# Patient Record
Sex: Male | Born: 1995 | Hispanic: Yes | Marital: Single | Smoking: Never smoker
Health system: Southern US, Community
[De-identification: ages and names within clinical notes are randomized; demographics above are authoritative.]

---
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
## 2018-10-08 ENCOUNTER — Emergency Department
Admission: EM | Admit: 2018-10-08 | Discharge: 2018-10-09 | Disposition: A | Discharge status: Home / Self Care | Payer: Self-pay | Attending: Emergency Medicine | Admitting: Emergency Medicine

## 2018-10-08 ENCOUNTER — Encounter: Payer: Self-pay | Admitting: *Deleted

## 2018-10-08 ENCOUNTER — Other Ambulatory Visit: Payer: Self-pay

## 2018-10-08 DIAGNOSIS — L235 Allergic contact dermatitis due to other chemical products: Secondary | ICD-10-CM | POA: Insufficient documentation

## 2018-10-08 MED ORDER — DEXAMETHASONE SODIUM PHOSPHATE 10 MG/ML IJ SOLN
10.0000 mg | Freq: Once | INTRAMUSCULAR | Status: AC
  Administered 2018-10-08: 10 mg via INTRAMUSCULAR
  Filled 2018-10-08: qty 1

## 2018-10-08 MED ORDER — PREDNISONE 20 MG PO TABS: 20.0000 mg | ORAL_TABLET | Freq: Two times a day (BID) | ORAL | 0 refills | Status: DC

## 2018-10-08 MED ORDER — FAMOTIDINE 20 MG PO TABS
40.0000 mg | ORAL_TABLET | Freq: Once | ORAL | Status: AC
  Administered 2018-10-08: 40 mg via ORAL
  Filled 2018-10-08: qty 2

## 2018-10-08 MED ORDER — DIPHENHYDRAMINE HCL 25 MG PO CAPS
50.0000 mg | ORAL_CAPSULE | Freq: Once | ORAL | Status: AC
  Administered 2018-10-08: 22:00:00 50 mg via ORAL
  Filled 2018-10-08: qty 2

## 2018-10-08 MED ORDER — HYDROXYZINE PAMOATE 25 MG PO CAPS: 25.0000 mg | ORAL_CAPSULE | Freq: Three times a day (TID) | ORAL | 0 refills | Status: DC | PRN

## 2018-10-08 MED ORDER — FAMOTIDINE 20 MG PO TABS: 20.0000 mg | ORAL_TABLET | Freq: Two times a day (BID) | ORAL | 0 refills | Status: DC

## 2018-10-08 NOTE — Discharge Instructions (Addendum)
You have been treated for a suspected allergic reaction to some chemical cleaners used at your job. Take the prescriptions as directed. Follow-up with [facility] or return to the ED as needed.

## 2018-10-08 NOTE — ED Triage Notes (Signed)
Pt has a rash to both arms and right side of abdomen.  Pt has itching.  Using otc meds without relief.  Pt alert.

## 2018-10-08 NOTE — ED Notes (Signed)
See triage note. Pt denies difficulty swallowing/SOB. NAD. Calm/alert. Rash noted on chest/arms/upper legs/back. Pt unsure of any trigger.

## 2018-10-09 ENCOUNTER — Telehealth: Payer: Self-pay | Admitting: Family Medicine

## 2018-10-09 MED ORDER — FAMOTIDINE 20 MG PO TABS: 20.0000 mg | ORAL_TABLET | Freq: Two times a day (BID) | ORAL | 0 refills | Status: DC

## 2018-10-09 MED ORDER — HYDROXYZINE PAMOATE 25 MG PO CAPS: 25.0000 mg | ORAL_CAPSULE | Freq: Three times a day (TID) | ORAL | 0 refills | Status: AC | PRN

## 2018-10-09 MED ORDER — PREDNISONE 20 MG PO TABS: 20.0000 mg | ORAL_TABLET | Freq: Two times a day (BID) | ORAL | 0 refills | Status: AC

## 2018-10-09 NOTE — Telephone Encounter (Signed)
Patient presented to triage with discharge paperwork from last night's visit. Medications resubmitted to [facility]. No changes.

## 2018-10-09 NOTE — ED Provider Notes (Signed)
[facility] Emergency Department Provider Note ____________________________________________  Time seen: 2155  I have reviewed the triage vital signs and the nursing notes.  HISTORY  Chief Complaint  Rash  HPI Corey Watson is a 23 y.o. male presents himself to the ED for evaluation of a rash noted to his arms and trunk.  Patient describes rash began about a week ago.  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  He does not wear PPE while spraying the particular chemical cleanser.  He does admit to splash and spray typically falling on his arms as well as splash and spray from the other coworkers around him that are using the same chemical.  He also reports itching to the arms when he gets sweaty and hot while at work.  He presented to the ED today after concern for continued rash and irritation after he use an over-the-counter allergy medicine and topical itch relief cream.  He denies any swelling, bruising, bleeding, chest pain, shortness of breath.  He denies any difficulty controlling his oral secretions.  He presents now for further evaluation of his symptoms.  He denies any known allergies to food and or drugs.  He denies any significant medical history and takes no daily medication.  History reviewed. No pertinent past medical history.  There are no active problems to display for this patient.  History reviewed. No pertinent surgical history.  Prior to Admission medications   Medication Sig Start Date End Date Taking? Authorizing Provider  famotidine (PEPCID) 20 MG tablet Take 1 tablet (20 mg total) by mouth 2 (two) times daily for 7 days. 10/08/18 10/15/18  Deante Blough, Dannielle Karvonen, PA-C  hydrOXYzine (VISTARIL) 25 MG capsule Take 1 capsule (25 mg total) by mouth 3 (three) times daily as needed for up to 10 days for itching. 10/08/18 10/18/18  Leemon Ayala, Dannielle Karvonen, PA-C  predniSONE (DELTASONE) 20 MG tablet Take 1 tablet (20 mg total)  by mouth 2 (two) times daily with a meal for 5 days. 10/08/18 10/13/18  Samreen Seltzer, Dannielle Karvonen, PA-C    Allergies Patient has no known allergies.  No family history on file.  Social History Social History   Tobacco Use  . Smoking status: Never Smoker  . Smokeless tobacco: Never Used  Substance Use Topics  . Alcohol use: Never    Frequency: Never  . Drug use: Never    Review of Systems  Constitutional: Negative for fever. Eyes: Negative for visual changes. ENT: Negative for sore throat. Cardiovascular: Negative for chest pain. Respiratory: Negative for shortness of breath. Gastrointestinal: Negative for abdominal pain, vomiting and diarrhea. Genitourinary: Negative for dysuria. Musculoskeletal: Negative for back pain. Skin: Positive for rash. Neurological: Negative for headaches, focal weakness or numbness. ____________________________________________  PHYSICAL EXAM:  VITAL SIGNS: ED Triage Vitals  Enc Vitals Group     BP 10/08/18 1720 125/84     Pulse Rate 10/08/18 1720 86     Resp 10/08/18 1720 20     Temp 10/08/18 1720 99.3 F (37.4 C)     Temp Source 10/08/18 1720 Oral     SpO2 10/08/18 1720 100 %     Weight --      Height --      Head Circumference --      Peak Flow --      Pain Score 10/08/18 1721 0     Pain Loc --      Pain Edu? --  Excl. in GC? --     Constitutional: Alert and oriented. Well appearing and in no distress. Head: Normocephalic and atraumatic. Eyes: Conjunctivae are normal. Normal extraocular movements Ears: Canals clear. TMs intact bilaterally. Nose: No congestion/rhinorrhea/epistaxis. Mouth/Throat: Mucous membranes are moist.  Uvula is midline tonsils are flat.  No oropharyngeal lesions noted. Neck: Supple. No thyromegaly. Hematological/Lymphatic/Immunological: No cervical lymphadenopathy. Cardiovascular: Normal rate, regular rhythm. Normal distal pulses. Respiratory: Normal respiratory effort. No  wheezes/rales/rhonchi. Musculoskeletal: Nontender with normal range of motion in all extremities.  Neurologic:  Normal gait without ataxia. Normal speech and language. No gross focal neurologic deficits are appreciated. Skin:  Skin is warm, dry and intact.  Patient with a fine maculopapular rash noted to the arms, primarily to the flexor surfaces; trunk, neck, and face.  There are no blisters or excoriations appreciated.  The fine rash is also erythematous compared to his skin. ____________________________________________  PROCEDURES  Procedures Decadron 10 mg IM Famotidine 40 mg PO Benadryl 50 mg PO ____________________________________________  INITIAL IMPRESSION / ASSESSMENT AND PLAN / ED COURSE  Corey Watson was evaluated in Emergency Department on 10/09/2018 for the symptoms described in the history of present illness. He was evaluated in the context of the global COVID-19 pandemic, which necessitated consideration that the patient might be at risk for infection with the SARS-CoV-2 virus that causes COVID-19. Institutional protocols and algorithms that pertain to the evaluation of patients at risk for COVID-19 are in a state of rapid change based on information released by regulatory bodies including the CDC and federal and state organizations. These policies and algorithms were followed during the patient's care in the ED.  Patient with ED evaluation of a probable contact dermatitis secondary to a chemical irritant.  Patient likely has exposure while at work, where he is not required to wear any PPE while using a particular chemical spray.  Patient reports improvement of his symptoms after ED administration of anti-histamines and steroids.  Be discharged with prescriptions for prednisone, famotidine, and Vistaril to take as directed.  He is encouraged to follow with a local community clinic or return to the ED for acutely worsening symptoms.  He should try to avoid working in that  particular area if possible.  Work is provided as requested. ____________________________________________  FINAL CLINICAL IMPRESSION(S) / ED DIAGNOSES  Final diagnoses:  Allergic dermatitis due to other chemical product      Lissa HoardMenshew, Shandi Godfrey V Bacon, PA-C 10/09/18 Corene Cornea0025    Goodman, Graydon, MD 10/12/18 [phone]

## 2018-12-21 ENCOUNTER — Emergency Department
Admission: EM | Admit: 2018-12-21 | Discharge: 2018-12-21 | Disposition: A | Discharge status: Home / Self Care | Payer: HRSA Program | Attending: Emergency Medicine | Admitting: Emergency Medicine

## 2018-12-21 ENCOUNTER — Emergency Department: Payer: HRSA Program

## 2018-12-21 ENCOUNTER — Other Ambulatory Visit: Payer: Self-pay

## 2018-12-21 ENCOUNTER — Encounter: Payer: Self-pay | Admitting: Intensive Care

## 2018-12-21 DIAGNOSIS — U071 COVID-19: Secondary | ICD-10-CM | POA: Insufficient documentation

## 2018-12-21 DIAGNOSIS — J189 Pneumonia, unspecified organism: Secondary | ICD-10-CM | POA: Insufficient documentation

## 2018-12-21 DIAGNOSIS — Z20822 Contact with and (suspected) exposure to covid-19: Secondary | ICD-10-CM

## 2018-12-21 DIAGNOSIS — R05 Cough: Secondary | ICD-10-CM | POA: Diagnosis present

## 2018-12-21 LAB — BASIC METABOLIC PANEL
Anion gap: 8 (ref 5–15)
BUN: 15 mg/dL (ref 6–20)
CO2: 27 mmol/L (ref 22–32)
Calcium: 9.2 mg/dL (ref 8.9–10.3)
Chloride: 106 mmol/L (ref 98–111)
Creatinine, Ser: 0.95 mg/dL (ref 0.61–1.24)
GFR calc Af Amer: >60 mL/min (ref >60)
GFR calc non Af Amer: >60 mL/min (ref >60)
Glucose, Bld: 91 mg/dL (ref 70–99)
Potassium: 3.4 mmol/L — ABNORMAL LOW (ref 3.5–5.1)
Sodium: 141 mmol/L (ref 135–145)

## 2018-12-21 LAB — CBC
HCT: 45.3 % (ref 39.0–52.0)
Hemoglobin: 16 g/dL (ref 13.0–17.0)
MCH: 31.8 pg (ref 26.0–34.0)
MCHC: 35.3 g/dL (ref 30.0–36.0)
MCV: 90.1 fL (ref 80.0–100.0)
Platelets: 185 10*3/uL (ref 150–400)
RBC: 5.03 MIL/uL (ref 4.22–5.81)
RDW: 11.7 % (ref 11.5–15.5)
WBC: 5.4 10*3/uL (ref 4.0–10.5)
nRBC: 0 % (ref 0.0–0.2)

## 2018-12-21 MED ORDER — CEFTRIAXONE SODIUM 1 G IJ SOLR
1.0000 g | Freq: Once | INTRAMUSCULAR | Status: AC
  Administered 2018-12-21: 21:00:00 1 g via INTRAMUSCULAR
  Filled 2018-12-21: qty 10

## 2018-12-21 MED ORDER — AZITHROMYCIN 250 MG PO TABS: ORAL_TABLET | ORAL | 0 refills | Status: AC

## 2018-12-21 MED ORDER — POTASSIUM CHLORIDE CRYS ER 20 MEQ PO TBCR
40.0000 meq | EXTENDED_RELEASE_TABLET | Freq: Once | ORAL | Status: AC
  Administered 2018-12-21: 22:00:00 40 meq via ORAL
  Filled 2018-12-21: qty 2

## 2018-12-21 MED ORDER — AZITHROMYCIN 500 MG PO TABS
500.0000 mg | ORAL_TABLET | Freq: Once | ORAL | Status: AC
  Administered 2018-12-21: 21:00:00 500 mg via ORAL
  Filled 2018-12-21: qty 1

## 2018-12-21 MED ORDER — ACETAMINOPHEN 325 MG PO TABS
650.0000 mg | ORAL_TABLET | Freq: Once | ORAL | Status: AC
  Administered 2018-12-21: 21:00:00 650 mg via ORAL
  Filled 2018-12-21: qty 2

## 2018-12-21 MED ORDER — SODIUM CHLORIDE 0.9 % IV SOLN: 1.0000 g | Freq: Once | INTRAVENOUS | Status: DC

## 2018-12-21 NOTE — ED Provider Notes (Signed)
[facility] Emergency Department Provider Note  ____________________________________________  Time seen: Approximately 8:05 PM  I have reviewed the triage vital signs and the nursing notes.   HISTORY  Chief Complaint Cough    HPI Corey Watson is a 23 y.o. male with no significant past medical history that presents emergency department for evaluation of nonproductive cough for 1.5 weeks.  Patient states that he has been taking NyQuil and drinking honey but cough has not resolved.  He does not smoke.  No history of asthma or allergies.  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  No fever, nasal congestion, sore throat, shortness of breath, chest pain.   History reviewed. No pertinent past medical history.  There are no active problems to display for this patient.   History reviewed. No pertinent surgical history.  Prior to Admission medications   Medication Sig Start Date End Date Taking? Authorizing Provider  azithromycin (ZITHROMAX Z-PAK) 250 MG tablet Take 2 tablets (500 mg) on  Day 1,  followed by 1 tablet (250 mg) once daily on Days 2 through 5. 12/21/18   Enid DerryWagner, Airis Barbee, PA-C  famotidine (PEPCID) 20 MG tablet Take 1 tablet (20 mg total) by mouth 2 (two) times daily for 7 days. 10/09/18 10/16/18  Chinita Pesterriplett, Cari B, FNP    Allergies Patient has no known allergies.  History reviewed. No pertinent family history.  Social History Social History   Tobacco Use  . Smoking status: Never Smoker  . Smokeless tobacco: Never Used  Substance Use Topics  . Alcohol use: Never    Frequency: Never  . Drug use: Never     Review of Systems  Constitutional: No fever/chills Eyes: No visual changes. No discharge. ENT: Negative for congestion and rhinorrhea. Cardiovascular: No chest pain. Respiratory: Positive for cough. No SOB. Gastrointestinal: No abdominal pain.  No nausea, no vomiting.  No diarrhea.  No constipation. Musculoskeletal: Negative for  musculoskeletal pain. Skin: Negative for rash, abrasions, lacerations, ecchymosis. Neurological: Negative for headaches.   ____________________________________________   PHYSICAL EXAM:  VITAL SIGNS: ED Triage Vitals  Enc Vitals Group     BP 12/21/18 1843 (!) 144/95     Pulse Rate 12/21/18 1843 90     Resp 12/21/18 1843 16     Temp 12/21/18 1843 99 F (37.2 C)     Temp Source 12/21/18 1843 Oral     SpO2 12/21/18 1843 100 %     Weight 12/21/18 1842 160 lb (72.6 kg)     Height 12/21/18 1842 5\' 6"  (1.676 m)     Head Circumference --      Peak Flow --      Pain Score 12/21/18 1842 0     Pain Loc --      Pain Edu? --      Excl. in GC? --      Constitutional: Alert and oriented. Well appearing and in no acute distress. Eyes: Conjunctivae are normal. PERRL. EOMI. No discharge. Head: Atraumatic. ENT: No frontal and maxillary sinus tenderness.      Ears: Tympanic membranes pearly gray with good landmarks. No discharge.      Nose: No congestion/rhinnorhea.      Mouth/Throat: Mucous membranes are moist. Oropharynx non-erythematous. Tonsils not enlarged. No exudates. Uvula midline. Neck: No stridor.   Hematological/Lymphatic/Immunilogical: No cervical lymphadenopathy. Cardiovascular: Normal rate, regular rhythm.  Good peripheral circulation. Respiratory: Normal respiratory effort without tachypnea or retractions. Lungs CTAB. Good air entry to the bases with no decreased or absent  breath sounds. Gastrointestinal: Bowel sounds 4 quadrants. Soft and nontender to palpation. No guarding or rigidity. No palpable masses. No distention. Musculoskeletal: Full range of motion to all extremities. No gross deformities appreciated. Neurologic:  Normal speech and language. No gross focal neurologic deficits are appreciated.  Skin:  Skin is warm, dry and intact. No rash noted. Psychiatric: Mood and affect are normal. Speech and behavior are normal. Patient exhibits appropriate insight and  judgement.   ____________________________________________   LABS (all labs ordered are listed, but only abnormal results are displayed)  Labs Reviewed  BASIC METABOLIC PANEL - Abnormal; Notable for the following components:      Result Value   Potassium 3.4 (*)    All other components within normal limits  NOVEL CORONAVIRUS, NAA (HOSP ORDER, SEND-OUT TO REF LAB; TAT 18-24 HRS)  CBC   ____________________________________________  EKG   ____________________________________________  RADIOLOGY Robinette Haines, personally viewed and evaluated these images (plain radiographs) as part of my medical decision making, as well as reviewing the written report by the radiologist.  Dg Chest 1 View  Result Date: 12/21/2018 CLINICAL DATA:  Cough for 1 week. EXAM: CHEST  1 VIEW COMPARISON:  None. FINDINGS: Normal heart size and mediastinal contours. Patchy bilateral airspace opacities most prominent at the right lung base. No pleural effusion or pneumothorax. No pulmonary edema. No acute osseous abnormalities. IMPRESSION: Patchy bilateral airspace opacities most prominent at the right lung base, suspicious for multifocal pneumonia, including atypical viral infection. Electronically Signed   By: Keith Rake M.D.   On: 12/21/2018 19:47    ____________________________________________    PROCEDURES  Procedure(s) performed:    Procedures    Medications  acetaminophen (TYLENOL) tablet 650 mg (650 mg Oral Given 12/21/18 2041)  azithromycin (ZITHROMAX) tablet 500 mg (500 mg Oral Given 12/21/18 2041)  cefTRIAXone (ROCEPHIN) injection 1 g (1 g Intramuscular Given 12/21/18 2050)  potassium chloride SA (KLOR-CON) CR tablet 40 mEq (40 mEq Oral Given 12/21/18 2154)     ____________________________________________   INITIAL IMPRESSION / ASSESSMENT AND PLAN / ED COURSE  Pertinent labs & imaging results that were available during my care of the patient were reviewed by me and considered in  my medical decision making (see chart for details).  Review of the [place] CSRS was performed in accordance of the [place] prior to dispensing any controlled drugs.     Patient's diagnosis is consistent with pneumonia and suspected Covid 19 infection. Vital signs and exam are reassuring.  Chest x-ray concerning for multifocal pneumonia, including atypical viral infection.  Lab work reassuring.  COVID test is pending.  Patient appears well and is staying well hydrated.Patient will be discharged home with prescriptions for azithromycin. Patient is to follow up with PCP as needed or otherwise directed. Patient is given ED precautions to return to the ED for any worsening or new symptoms.  Corey Watson was evaluated in Emergency Department on 12/21/2018 for the symptoms described in the history of present illness. He was evaluated in the context of the global COVID-19 pandemic, which necessitated consideration that the patient might be at risk for infection with the SARS-CoV-2 virus that causes COVID-19. Institutional protocols and algorithms that pertain to the evaluation of patients at risk for COVID-19 are in a state of rapid change based on information released by regulatory bodies including the CDC and federal and state organizations. These policies and algorithms were followed during the patient's care in the ED.   ____________________________________________  FINAL CLINICAL IMPRESSION(S) /  ED DIAGNOSES  Final diagnoses:  Community acquired pneumonia, unspecified laterality  Suspected Covid-19 Virus Infection      NEW MEDICATIONS STARTED DURING THIS VISIT:  ED Discharge Orders         Ordered    azithromycin (ZITHROMAX Z-PAK) 250 MG tablet     12/21/18 2136              This chart was dictated using voice recognition software/Dragon. Despite best efforts to proofread, errors can occur which can change the meaning. Any change was purely unintentional.    Enid Derry,  PA-C 12/21/18 2224    Concha Se, MD 12/22/18 Nicholos Johns

## 2018-12-21 NOTE — Discharge Instructions (Signed)
Your chest x-ray shows that you have pneumonia.  Pneumonia can be from a virus or a bacteria.  I suspect that your pneumonia is from COVID-19.  I am also treating you for bacterial pneumonia with azithromycin.  Please start azithromycin in the morning.  Your COVID results will be ready about 2 days.  Please return to the emergency department for any worsening of symptoms.

## 2018-12-21 NOTE — ED Triage Notes (Signed)
Patient c/o cough for about a week. No fevers.

## 2018-12-23 ENCOUNTER — Telehealth: Payer: Self-pay | Admitting: Emergency Medicine

## 2018-12-23 ENCOUNTER — Telehealth: Payer: Self-pay | Admitting: *Deleted

## 2018-12-23 LAB — NOVEL CORONAVIRUS, NAA (HOSP ORDER, SEND-OUT TO REF LAB; TAT 18-24 HRS): SARS-CoV-2, NAA: DETECTED — AB

## 2018-12-23 NOTE — Telephone Encounter (Addendum)
Called patient and he is aware of covid result.  [facility] interpreter was utilized.

## 2018-12-23 NOTE — Telephone Encounter (Signed)
Reviewed positive [phone] results with patient. He was seen in the ED on 9/29 and prescribed Zithromax.  Encouraged fluids and rest. Stay home in isolation area, only leave for medical reasons and wear a mask must leave. Wash hands frequently, disinfect common areas daily. Seek treatment if breathing worsens. Reviewed isolation date and requirements to end isolation. Stated he understood. Will notify [facility].

## 2019-01-01 ENCOUNTER — Other Ambulatory Visit: Payer: Self-pay

## 2019-01-01 ENCOUNTER — Encounter: Payer: Self-pay | Admitting: Emergency Medicine

## 2019-01-01 ENCOUNTER — Emergency Department: Payer: Self-pay

## 2019-01-01 ENCOUNTER — Emergency Department
Admission: EM | Admit: 2019-01-01 | Discharge: 2019-01-01 | Disposition: A | Discharge status: Home / Self Care | Payer: Self-pay | Attending: Emergency Medicine | Admitting: Emergency Medicine

## 2019-01-01 DIAGNOSIS — R0789 Other chest pain: Secondary | ICD-10-CM | POA: Insufficient documentation

## 2019-01-01 LAB — CBC
HCT: 47.8 % (ref 39.0–52.0)
Hemoglobin: 16.7 g/dL (ref 13.0–17.0)
MCH: 31.9 pg (ref 26.0–34.0)
MCHC: 34.9 g/dL (ref 30.0–36.0)
MCV: 91.2 fL (ref 80.0–100.0)
Platelets: 257 10*3/uL (ref 150–400)
RBC: 5.24 MIL/uL (ref 4.22–5.81)
RDW: 11.7 % (ref 11.5–15.5)
WBC: 6.4 10*3/uL (ref 4.0–10.5)
nRBC: 0 % (ref 0.0–0.2)

## 2019-01-01 LAB — BASIC METABOLIC PANEL
Anion gap: 7 (ref 5–15)
BUN: 10 mg/dL (ref 6–20)
CO2: 26 mmol/L (ref 22–32)
Calcium: 8.7 mg/dL — ABNORMAL LOW (ref 8.9–10.3)
Chloride: 106 mmol/L (ref 98–111)
Creatinine, Ser: 0.86 mg/dL (ref 0.61–1.24)
GFR calc Af Amer: >60 mL/min (ref >60)
GFR calc non Af Amer: >60 mL/min (ref >60)
Glucose, Bld: 85 mg/dL (ref 70–99)
Potassium: 3.4 mmol/L — ABNORMAL LOW (ref 3.5–5.1)
Sodium: 139 mmol/L (ref 135–145)

## 2019-01-01 LAB — TROPONIN I (HIGH SENSITIVITY): Troponin I (High Sensitivity): 3 ng/L (ref <18)

## 2019-01-01 MED ORDER — SODIUM CHLORIDE 0.9% FLUSH: 3.0000 mL | Freq: Once | INTRAVENOUS | Status: DC

## 2019-01-01 MED ORDER — FAMOTIDINE 20 MG PO TABS: 20.0000 mg | ORAL_TABLET | Freq: Two times a day (BID) | ORAL | 0 refills | Status: AC

## 2019-01-01 MED ORDER — IBUPROFEN 600 MG PO TABS: 600.0000 mg | ORAL_TABLET | Freq: Four times a day (QID) | ORAL | 0 refills | Status: AC | PRN

## 2019-01-01 NOTE — ED Triage Notes (Signed)
States tested positive for COVID 2 weeks ago. Began chest pain 4 days ago. Denies still having cough.

## 2019-01-01 NOTE — Discharge Instructions (Addendum)
Take the ibuprofen as needed up to every 6 hours for pain.  You should also take the Pepcid for stomach acid twice daily for the next few weeks.  Return to the ER for new, worsening, persistent severe pain, difficulty breathing, fever, weakness, or any other new or worsening symptoms that concern you.

## 2019-01-01 NOTE — ED Provider Notes (Signed)
[facility] Emergency Department Provider Note ____________________________________________   First MD Initiated Contact with Patient 01/01/19 1648     (approximate)  I have reviewed the triage vital signs and the nursing notes.   HISTORY  Chief Complaint covid and Chest Pain    HPI Corey Watson is a 23 y.o. male with PMH as noted below who presents with chest pain which he cannot describe the quality of.  He states it is intermittent, substernal, and not associated with shortness of breath, lightheadedness, or nausea.  It is not exertional.  Sometimes it feels like burning.  He states it started over approximately the last 3 to 4 days.  The patient was diagnosed with COVID-19 few weeks ago although he states that he is feeling better and no longer has any cough.   History reviewed. No pertinent past medical history.  There are no active problems to display for this patient.   History reviewed. No pertinent surgical history.  Prior to Admission medications   Medication Sig Start Date End Date Taking? Authorizing Provider  azithromycin (ZITHROMAX Z-PAK) 250 MG tablet Take 2 tablets (500 mg) on  Day 1,  followed by 1 tablet (250 mg) once daily on Days 2 through 5. 12/21/18   Enid DerryWagner, Ashley, PA-C  famotidine (PEPCID) 20 MG tablet Take 1 tablet (20 mg total) by mouth 2 (two) times daily for 15 days. 01/01/19 01/16/19  Dionne BucySiadecki, Chloris Marcoux, MD  ibuprofen (ADVIL) 600 MG tablet Take 1 tablet (600 mg total) by mouth every 6 (six) hours as needed. 01/01/19   Dionne BucySiadecki, Lovie Zarling, MD    Allergies Patient has no known allergies.  No family history on file.  Social History Social History   Tobacco Use  . Smoking status: Never Smoker  . Smokeless tobacco: Never Used  Substance Use Topics  . Alcohol use: Never    Frequency: Never  . Drug use: Never    Review of Systems  Constitutional: No fever/chills. Eyes: No redness. ENT: No sore throat.  Cardiovascular: Positive for chest pain. Respiratory: Denies shortness of breath. Gastrointestinal: No vomiting or diarrhea.  Genitourinary: Negative for flank pain.  Musculoskeletal: Negative for back pain. Skin: Negative for rash. Neurological: Negative for headache.   ____________________________________________   PHYSICAL EXAM:  VITAL SIGNS: ED Triage Vitals  Enc Vitals Group     BP 01/01/19 1521 (!) 147/90     Pulse Rate 01/01/19 1521 84     Resp 01/01/19 1521 20     Temp 01/01/19 1521 98.8 F (37.1 C)     Temp Source 01/01/19 1521 Oral     SpO2 01/01/19 1521 100 %     Weight 01/01/19 1522 155 lb (70.3 kg)     Height 01/01/19 1522 5\' 6"  (1.676 m)     Head Circumference --      Peak Flow --      Pain Score 01/01/19 1522 0     Pain Loc --      Pain Edu? --      Excl. in GC? --     Constitutional: Alert and oriented. Well appearing and in no acute distress. Eyes: Conjunctivae are normal.  Head: Atraumatic. Nose: No congestion/rhinnorhea. Mouth/Throat: Mucous membranes are moist.   Neck: Normal range of motion.  Cardiovascular: Normal rate, regular rhythm. Good peripheral circulation. Respiratory: Normal respiratory effort.  No retractions.  Gastrointestinal: No distention.  Musculoskeletal: No lower extremity edema.  No calf or popliteal swelling or tenderness.  Extremities warm and  well perfused.  Neurologic:  Normal speech and language. No gross focal neurologic deficits are appreciated.  Skin:  Skin is warm and dry. No rash noted. Psychiatric: Mood and affect are normal. Speech and behavior are normal.  ____________________________________________   LABS (all labs ordered are listed, but only abnormal results are displayed)  Labs Reviewed  BASIC METABOLIC PANEL - Abnormal; Notable for the following components:      Result Value   Potassium 3.4 (*)    Calcium 8.7 (*)    All other components within normal limits  CBC  TROPONIN I (HIGH SENSITIVITY)    ____________________________________________  EKG  ED ECG REPORT I, Arta Silence, the attending physician, personally viewed and interpreted this ECG.  Date: 01/01/2019 EKG Time: 1519 Rate: 91 Rhythm: normal sinus rhythm QRS Axis: Left axis Intervals: normal ST/T Wave abnormalities: normal Narrative Interpretation: no evidence of acute ischemia  ____________________________________________  RADIOLOGY  CXR: Decreased opacities with some peribronchial thickening  ____________________________________________   PROCEDURES  Procedure(s) performed: No  Procedures  Critical Care performed: No ____________________________________________   INITIAL IMPRESSION / ASSESSMENT AND PLAN / ED COURSE  Pertinent labs & imaging results that were available during my care of the patient were reviewed by me and considered in my medical decision making (see chart for details).  23 year old male with a recent diagnosis of COVID-19 presents with atypical chest pain over the last several days although his other symptoms are improving or resolved.  He has no fever or shortness of breath.  He states the pain is sometimes burning, and is intermittent and nonexertional.  On exam, the patient is well-appearing.  His vital signs are normal.  The physical exam is unremarkable except that the pain is slightly reproducible on exam.  EKG is nonischemic.  Overall presentation is consistent with noncardiac chest pain, likely either musculoskeletal chest wall pain or possible GERD.  I will treat the patient both with NSAID and Pepcid.  It also could be due to inflammation or costochondritis after coughing from COVID-19.  There is no evidence of ACS or other cardiac etiology.  The patient has no PE risk factors and is PERC negative.  Lab work-up including troponin is negative.  Chest x-ray is improving from his prior.  The patient is stable for discharge home.  Return precautions given, and he expresses  understanding.  _____________________________  Corey Watson was evaluated in Emergency Department on 01/01/2019 for the symptoms described in the history of present illness. He was evaluated in the context of the global COVID-19 pandemic, which necessitated consideration that the patient might be at risk for infection with the SARS-CoV-2 virus that causes COVID-19. Institutional protocols and algorithms that pertain to the evaluation of patients at risk for COVID-19 are in a state of rapid change based on information released by regulatory bodies including the CDC and federal and state organizations. These policies and algorithms were followed during the patient's care in the ED. ____________________________________________   FINAL CLINICAL IMPRESSION(S) / ED DIAGNOSES  Final diagnoses:  Atypical chest pain      NEW MEDICATIONS STARTED DURING THIS VISIT:  New Prescriptions   FAMOTIDINE (PEPCID) 20 MG TABLET    Take 1 tablet (20 mg total) by mouth 2 (two) times daily for 15 days.   IBUPROFEN (ADVIL) 600 MG TABLET    Take 1 tablet (600 mg total) by mouth every 6 (six) hours as needed.     Note:  This document was prepared using Systems analyst and  may include unintentional dictation errors.   Dionne Bucy, MD 01/01/19 1747

## 2020-08-26 IMAGING — CR DG CHEST 1V
1 series · 1 of 1 positions shown · non-contrast
Comparison: None.

CLINICAL DATA: Cough for 1 week.

EXAM:
CHEST  1 VIEW

[chest pa]
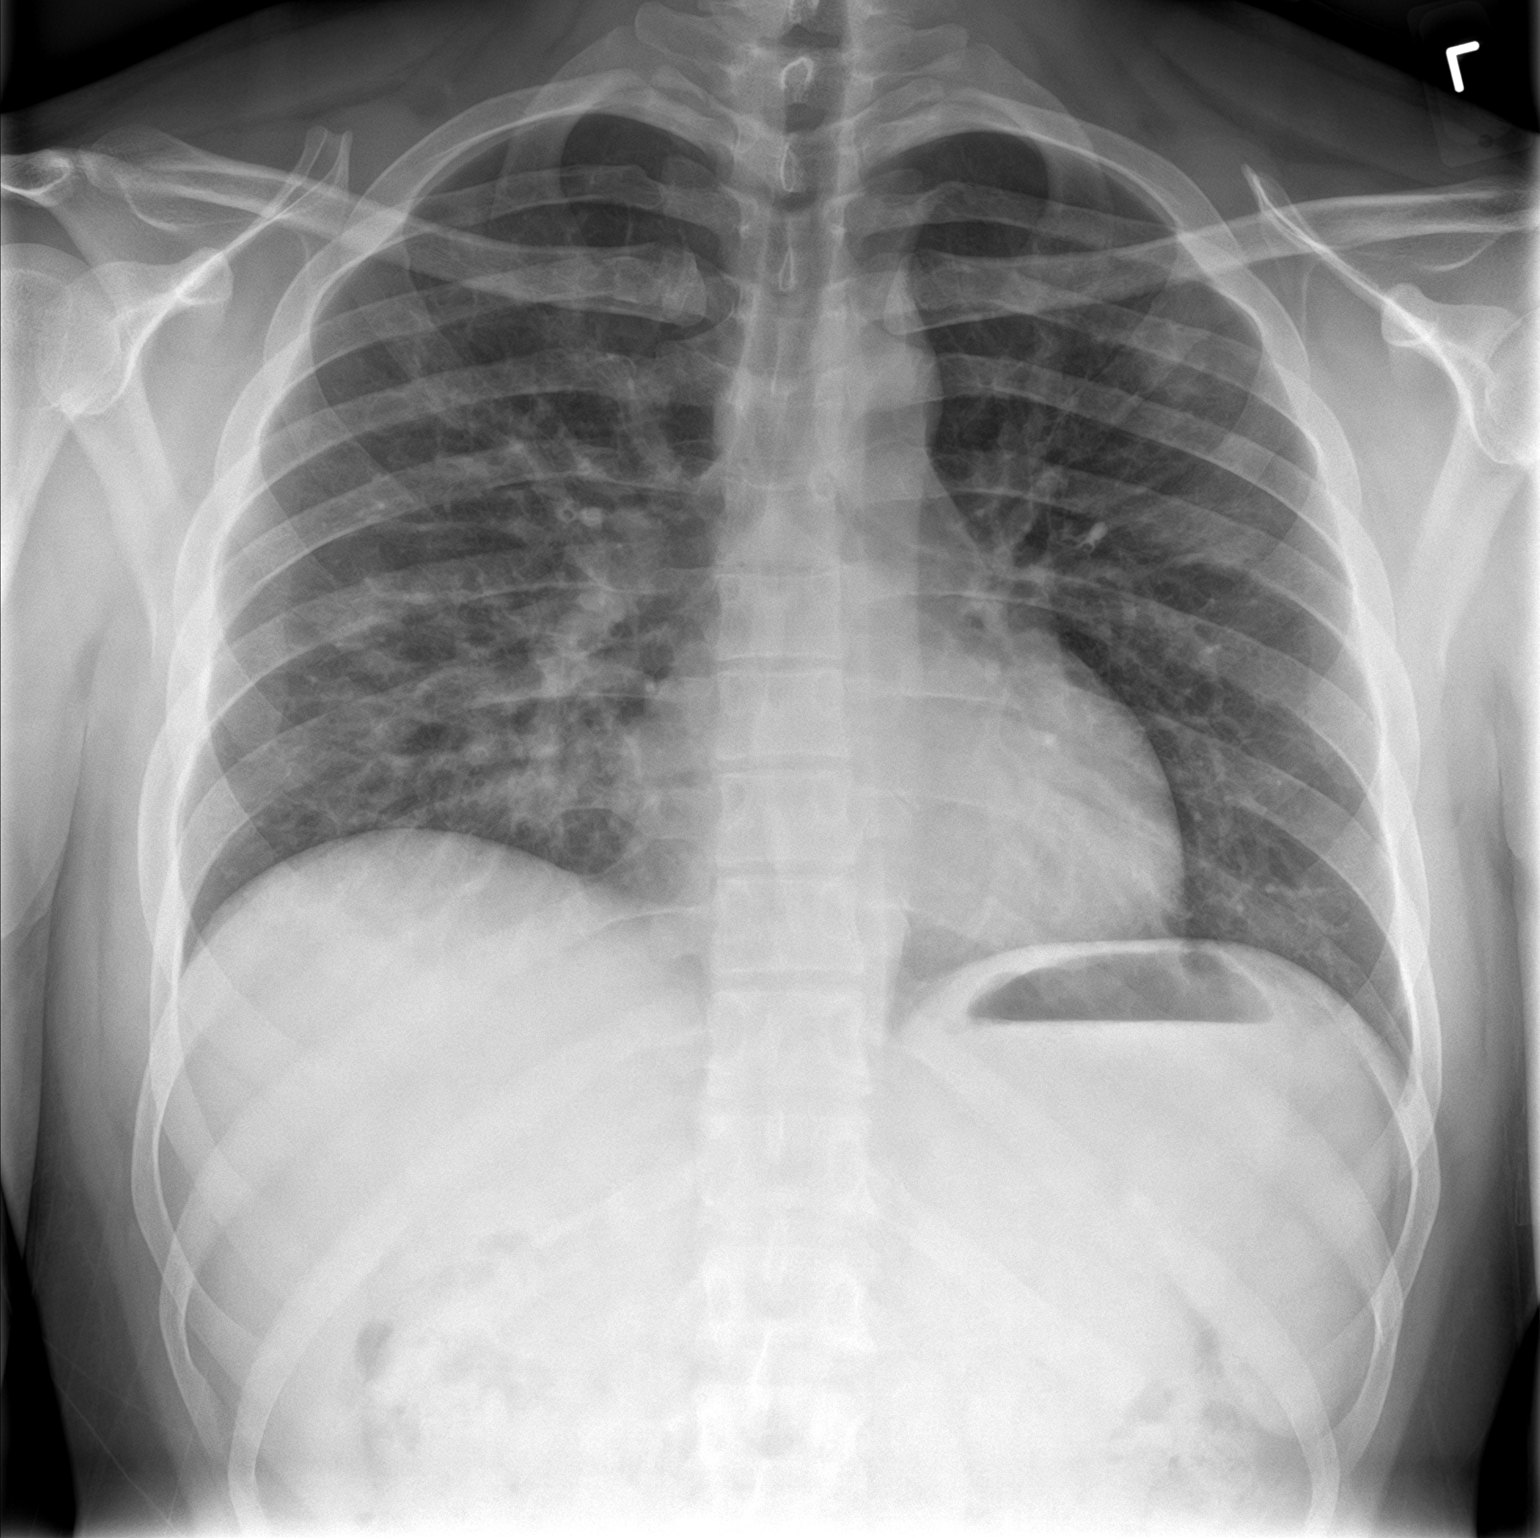

[1 of 1 positions shown; findings below may reference images not displayed]

FINDINGS: Normal heart size and mediastinal contours. Patchy bilateral
airspace opacities most prominent at the right lung base. No pleural
effusion or pneumothorax. No pulmonary edema. No acute osseous
abnormalities.
IMPRESSION: Patchy bilateral airspace opacities most prominent at the right lung
base, suspicious for multifocal pneumonia, including atypical viral
infection.

## 2020-09-06 IMAGING — DX DG CHEST 1V PORT
1 series · 1 of 1 positions shown · non-contrast
Comparison: Chest radiograph 12/21/2018

CLINICAL DATA: Chest pain x4 days. Patient states that he was dx
with covid 2 weeks ago.

EXAM:
PORTABLE CHEST 1 VIEW

[chest ap]
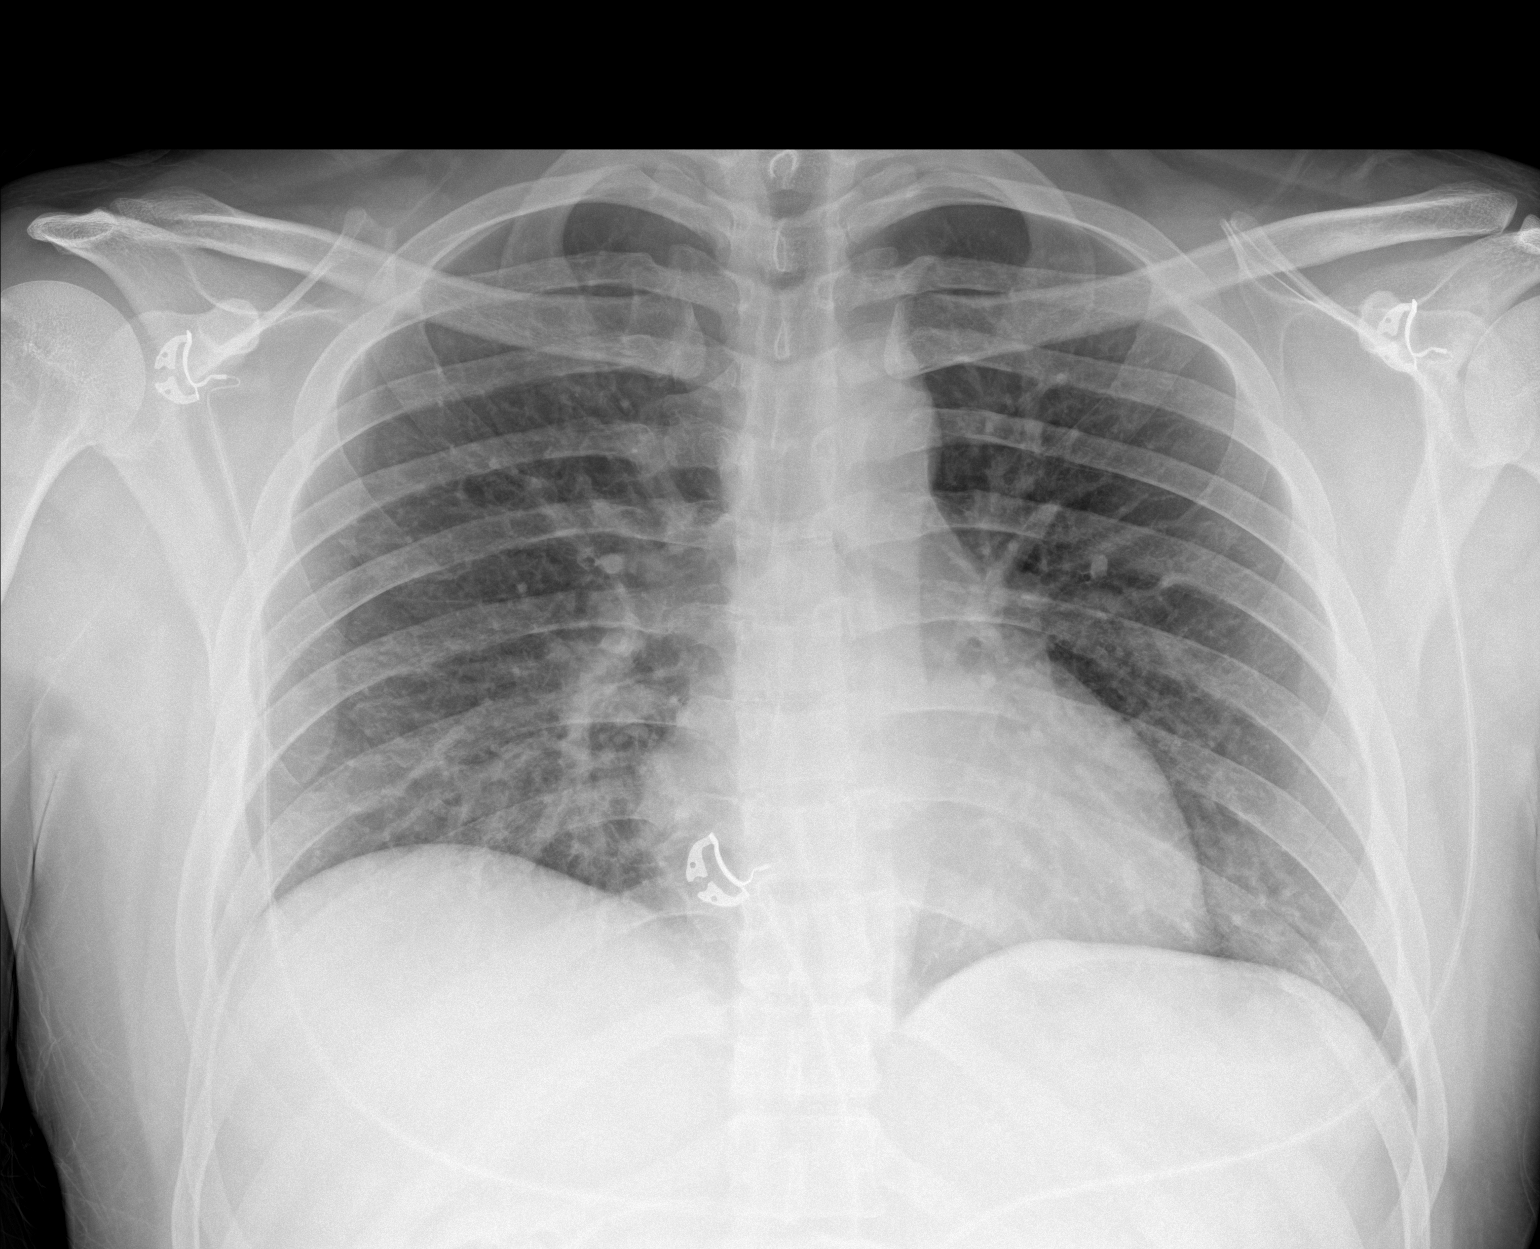

[1 of 1 positions shown; findings below may reference images not displayed]

FINDINGS: The heart size and mediastinal contours are within normal limits.
Interval decrease in the previously seen bilateral patchy pulmonary
opacities. There is mild bilateral peribronchial thickening. No
pneumothorax or large pleural effusion. No acute finding in the
visualized skeleton.
IMPRESSION: 1. Interval decrease in the previously seen bilateral patchy
pulmonary opacities.
2. Mild bilateral peribronchial thickening may represent residual
airway inflammation.

## 2023-11-30 ENCOUNTER — Other Ambulatory Visit: Payer: Self-pay

## 2023-11-30 ENCOUNTER — Encounter: Payer: Self-pay | Admitting: Emergency Medicine

## 2023-11-30 ENCOUNTER — Emergency Department
Admission: EM | Admit: 2023-11-30 | Discharge: 2023-11-30 | Disposition: A | Discharge status: Home / Self Care | Payer: Self-pay | Attending: Emergency Medicine | Admitting: Emergency Medicine

## 2023-11-30 DIAGNOSIS — R7309 Other abnormal glucose: Secondary | ICD-10-CM | POA: Insufficient documentation

## 2023-11-30 DIAGNOSIS — R339 Retention of urine, unspecified: Secondary | ICD-10-CM | POA: Insufficient documentation

## 2023-11-30 DIAGNOSIS — R3911 Hesitancy of micturition: Secondary | ICD-10-CM | POA: Insufficient documentation

## 2023-11-30 LAB — URINALYSIS, ROUTINE W REFLEX MICROSCOPIC
Bilirubin Urine: NEGATIVE
Glucose, UA: NEGATIVE mg/dL
Hgb urine dipstick: NEGATIVE
Ketones, ur: NEGATIVE mg/dL
Leukocytes,Ua: NEGATIVE
Nitrite: NEGATIVE
Protein, ur: NEGATIVE mg/dL
Specific Gravity, Urine: 1.02 (ref 1.005–1.030)
pH: 6 (ref 5.0–8.0)

## 2023-11-30 LAB — CBG MONITORING, ED: Glucose-Capillary: 102 mg/dL — ABNORMAL HIGH (ref 70–99)

## 2023-11-30 NOTE — Discharge Instructions (Addendum)
 Your workup in the Emergency Department today was reassuring.  We did not find any specific abnormalities.  We recommend you drink plenty of fluids, take your regular medications and/or any new ones prescribed today, and follow up with the doctor(s) listed in these documents as recommended.  Return to the Emergency Department if you develop new or worsening symptoms that concern you.

## 2023-11-30 NOTE — ED Triage Notes (Signed)
 Patient reports urinary retention starting tonight. He states that he feels he has to urinate more frequently but with little output. Denies pain with urination.

## 2023-11-30 NOTE — ED Provider Notes (Addendum)
 [facility] Provider Note    Event Date/Time   First MD Initiated Contact with Patient 11/30/23 0301     (approximate)   History   Urinary Retention   HPI Corey Watson is a 28 y.o. male who presents for evaluation of urinary hesitancy.  He said he feels like he needs to use the bathroom a lot and that he has a hard time emptying his bladder when he does so.  However he said that once he got here he had a big urination, more than he has had recently.  He said he has no burning when he urinates and no other discharge.  He has a little bit of lower abdominal discomfort but he thinks that might be because of the alcohol that he drank last night.  No nausea, no vomiting.  He does not think that he has been constipated recently.  He denies any drug use and says he is not taking any medications.     Physical Exam   Triage Vital Signs: ED Triage Vitals  Encounter Vitals Group     BP 11/30/23 0252 (!) 160/96     Girls Systolic BP Percentile --      Girls Diastolic BP Percentile --      Boys Systolic BP Percentile --      Boys Diastolic BP Percentile --      Pulse Rate 11/30/23 0252 75     Resp 11/30/23 0252 16     Temp 11/30/23 0252 100.2 F (37.9 C)     Temp Source 11/30/23 0252 Oral     SpO2 11/30/23 0252 100 %     Weight 11/30/23 0254 86.2 kg (190 lb)     Height 11/30/23 0254 1.676 m (5' 6)     Head Circumference --      Peak Flow --      Pain Score 11/30/23 0254 5     Pain Loc --      Pain Education --      Exclude from Growth Chart --     Most recent vital signs: Vitals:   11/30/23 0252  BP: (!) 160/96  Pulse: 75  Resp: 16  Temp: 100.2 F (37.9 C)  SpO2: 100%    General: Awake, no distress.  CV:  Good peripheral perfusion.  Resp:  Normal effort. Speaking easily and comfortably, no accessory muscle usage nor intercostal retractions.   Abd:  No distention.  Soft with no tenderness to palpation.  He said that he has a little bit  of aching in the middle of his lower abdomen   ED Results / Procedures / Treatments   Labs (all labs ordered are listed, but only abnormal results are displayed) Labs Reviewed  URINALYSIS, ROUTINE W REFLEX MICROSCOPIC - Abnormal; Notable for the following components:      Result Value   Color, Urine YELLOW (*)    APPearance CLEAR (*)    All other components within normal limits  CBG MONITORING, ED - Abnormal; Notable for the following components:   Glucose-Capillary 102 (*)    All other components within normal limits  CHLAMYDIA/NGC RT PCR ([facility] ONLY)               PROCEDURES:  Critical Care performed: No  Procedures    IMPRESSION / MDM / ASSESSMENT AND PLAN / ED COURSE  I reviewed the triage vital signs and the nursing notes.  Differential diagnosis includes, but is not limited to, UTI, medication or drug side effect, less likely STD.  Patient's presentation is most consistent with acute, uncomplicated illness.  Labs/studies ordered: Urinalysis, CBG, chlamydia/gonorrhea  Interventions/Medications given:  Medications - No data to display  (Note:  hospital course my include additional interventions and/or labs/studies not listed above.)   Vitals are notable for some hypertension and a very minimally elevated temperature.  He has no other infectious symptoms.  His UA is normal and his bladder scan showed 0 mL.  He has a reassuring physical exam.  He said maybe I overreacted.  I offered some additional imaging but explained that there is no indication he has an emergent condition.  He would rather leave and if he has any additional issues he will come back or follow-up as an outpatient.  The patient's medical screening exam is reassuring with no indication of an emergent medical condition requiring hospitalization or additional evaluation at this point.  The patient is safe and appropriate for discharge and outpatient follow up.          FINAL CLINICAL IMPRESSION(S) / ED DIAGNOSES   Final diagnoses:  Urinary hesitancy     Rx / DC Orders   ED Discharge Orders     None        Note:  This document was prepared using Dragon voice recognition software and may include unintentional dictation errors.   Gordan Huxley, MD 11/30/23 [id number]    Gordan Huxley, MD 11/30/23 [phone]
